# Patient Record
Sex: Male | Born: 1970 | Race: White | Hispanic: No | Marital: Married | Smoking: Never smoker
Health system: Southern US, Community
[De-identification: ages and names within clinical notes are randomized; demographics above are authoritative.]

## PROBLEM LIST (undated history)

## (undated) DIAGNOSIS — G43909 Migraine, unspecified, not intractable, without status migrainosus: Secondary | ICD-10-CM

## (undated) DIAGNOSIS — Z9889 Other specified postprocedural states: Secondary | ICD-10-CM

## (undated) DIAGNOSIS — E271 Primary adrenocortical insufficiency: Secondary | ICD-10-CM

## (undated) DIAGNOSIS — G030 Nonpyogenic meningitis: Secondary | ICD-10-CM

## (undated) DIAGNOSIS — E237 Disorder of pituitary gland, unspecified: Secondary | ICD-10-CM

## (undated) DIAGNOSIS — S069X9A Unspecified intracranial injury with loss of consciousness of unspecified duration, initial encounter: Secondary | ICD-10-CM

## (undated) DIAGNOSIS — G9600 Cerebrospinal fluid leak, unspecified: Secondary | ICD-10-CM

## (undated) DIAGNOSIS — S069XAA Unspecified intracranial injury with loss of consciousness status unknown, initial encounter: Secondary | ICD-10-CM

## (undated) HISTORY — PX: BACK SURGERY: SHX140

## (undated) HISTORY — PX: LUMBAR PERITONEAL SHUNT: SHX1984

## (undated) HISTORY — PX: OTHER SURGICAL HISTORY: SHX169

## (undated) HISTORY — PX: BRAIN TUMOR EXCISION: SHX577

---

## 2011-07-29 ENCOUNTER — Emergency Department (HOSPITAL_BASED_OUTPATIENT_CLINIC_OR_DEPARTMENT_OTHER)
Admission: EM | Admit: 2011-07-29 | Discharge: 2011-07-29 | Disposition: A | Discharge status: Home / Self Care | Payer: Managed Care, Other (non HMO) | Attending: Emergency Medicine | Admitting: Emergency Medicine

## 2011-07-29 ENCOUNTER — Encounter (HOSPITAL_BASED_OUTPATIENT_CLINIC_OR_DEPARTMENT_OTHER): Payer: Self-pay | Admitting: *Deleted

## 2011-07-29 ENCOUNTER — Emergency Department (INDEPENDENT_AMBULATORY_CARE_PROVIDER_SITE_OTHER): Payer: Managed Care, Other (non HMO)

## 2011-07-29 DIAGNOSIS — W298XXA Contact with other powered powered hand tools and household machinery, initial encounter: Secondary | ICD-10-CM | POA: Insufficient documentation

## 2011-07-29 DIAGNOSIS — S61209A Unspecified open wound of unspecified finger without damage to nail, initial encounter: Secondary | ICD-10-CM

## 2011-07-29 DIAGNOSIS — S61219A Laceration without foreign body of unspecified finger without damage to nail, initial encounter: Secondary | ICD-10-CM

## 2011-07-29 DIAGNOSIS — M79609 Pain in unspecified limb: Secondary | ICD-10-CM | POA: Insufficient documentation

## 2011-07-29 DIAGNOSIS — Y92009 Unspecified place in unspecified non-institutional (private) residence as the place of occurrence of the external cause: Secondary | ICD-10-CM | POA: Insufficient documentation

## 2011-07-29 DIAGNOSIS — R51 Headache: Secondary | ICD-10-CM

## 2011-07-29 MED ORDER — OXYCODONE-ACETAMINOPHEN 5-325 MG PO TABS: 2.0000 | ORAL_TABLET | ORAL | Status: AC | PRN

## 2011-07-29 MED ORDER — AMOXICILLIN-POT CLAVULANATE 875-125 MG PO TABS: 1.0000 | ORAL_TABLET | Freq: Two times a day (BID) | ORAL | Status: AC

## 2011-07-29 MED ORDER — LIDOCAINE HCL 2 % IJ SOLN
INTRAMUSCULAR | Status: AC
  Administered 2011-07-29: 20 mg
  Filled 2011-07-29: qty 1

## 2011-07-29 NOTE — ED Notes (Signed)
MD at bedside. 

## 2011-07-29 NOTE — Discharge Instructions (Signed)
Facial Laceration  A facial laceration is a cut on the face. Lacerations usually heal quickly, but they need special care to reduce scarring. It will take 1 to 2 years for the scar to lose its redness and to heal completely.  TREATMENT   Some facial lacerations may not require closure. Some lacerations may not be able to be closed due to an increased risk of infection. It is important to see your caregiver as soon as possible after an injury to minimize the risk of infection and to maximize the opportunity for successful closure.  If closure is appropriate, pain medicines may be given, if needed. The wound will be cleaned to help prevent infection. Your caregiver will use stitches (sutures), staples, wound glue (adhesive), or skin adhesive strips to repair the laceration. These tools bring the skin edges together to allow for faster healing and a better cosmetic outcome. However, all wounds will heal with a scar.   Once the wound has healed, scarring can be minimized by covering the wound with sunscreen during the day for 1 full year. Use a sunscreen with an SPF of at least 30. Sunscreen helps to reduce the pigment that will form in the scar. When applying sunscreen to a completely healed wound, massage the scar for a few minutes to help reduce the appearance of the scar. Use circular motions with your fingertips, on and around the scar. Do not massage a healing wound.  HOME CARE INSTRUCTIONS  For sutures:   Keep the wound clean and dry.   If you were given a bandage (dressing), you should change it at least once a day. Also change the dressing if it becomes wet or dirty, or as directed by your caregiver.   Wash the wound with soap and water 2 times a day. Rinse the wound off with water to remove all soap. Pat the wound dry with a clean towel.   After cleaning, apply a thin layer of the antibiotic ointment recommended by your caregiver. This will help prevent infection and keep the dressing from sticking.   You  may shower as usual after the first 24 hours. Do not soak the wound in water until the sutures are removed.   Only take over-the-counter or prescription medicines for pain, discomfort, or fever as directed by your caregiver.   Get your sutures removed as directed by your caregiver. With facial lacerations, sutures should usually be taken out after 4 to 5 days to avoid stitch marks.   Wait a few days after your sutures are removed before applying makeup.  For skin adhesive strips:   Keep the wound clean and dry.   Do not get the skin adhesive strips wet. You may bathe carefully, using caution to keep the wound dry.   If the wound gets wet, pat it dry with a clean towel.   Skin adhesive strips will fall off on their own. You may trim the strips as the wound heals. Do not remove skin adhesive strips that are still stuck to the wound. They will fall off in time.  For wound adhesive:   You may briefly wet your wound in the shower or bath. Do not soak or scrub the wound. Do not swim. Avoid periods of heavy perspiration until the skin adhesive has fallen off on its own. After showering or bathing, gently pat the wound dry with a clean towel.   Do not apply liquid medicine, cream medicine, ointment medicine, or makeup to your wound while the   skin adhesive is in place. This may loosen the film before your wound is healed.   If a dressing is placed over the wound, be careful not to apply tape directly over the skin adhesive. This may cause the adhesive to be pulled off before the wound is healed.   Avoid prolonged exposure to sunlight or tanning lamps while the skin adhesive is in place. Exposure to ultraviolet light in the first year will darken the scar.   The skin adhesive will usually remain in place for 5 to 10 days, then naturally fall off the skin. Do not pick at the adhesive film.  You may need a tetanus shot if:   You cannot remember when you had your last tetanus shot.   You have never had a tetanus  shot.  If you get a tetanus shot, your arm may swell, get red, and feel warm to the touch. This is common and not a problem. If you need a tetanus shot and you choose not to have one, there is a rare chance of getting tetanus. Sickness from tetanus can be serious.  SEEK IMMEDIATE MEDICAL CARE IF:   You develop redness, pain, or swelling around the wound.   There is yellowish-white fluid (pus) coming from the wound.   You develop chills or a fever.  MAKE SURE YOU:   Understand these instructions.   Will watch your condition.   Will get help right away if you are not doing well or get worse.  Document Released: 06/17/2004 Document Revised: 04/29/2011 Document Reviewed: 11/02/2010  [employer] Patient Information 2012 [employer].

## 2011-07-29 NOTE — ED Provider Notes (Signed)
History     CSN: [id number]  Arrival date & time 07/29/11  1400   First MD Initiated Contact with Patient 07/29/11 1421      Chief Complaint  Patient presents with  . Finger Injury    Pt. reports he was using a drill and the drill slipped and went into the L Middle finger.  Pt. also reports he has a headache.  Pt. reports he tries to handle pain and has severe headaches and can no longer work.    (Consider location/radiation/quality/duration/timing/severity/associated sxs/prior treatment) Patient is a 41 y.o. male presenting with hand injury. The history is provided by the patient. No language interpreter was used.  Hand Injury  The incident occurred 1 to 2 hours ago. The incident occurred at home. The injury mechanism was an incision. The pain is present in the left fingers. The quality of the pain is described as aching. The pain is at a severity of 6/10. The pain is moderate. The pain has been constant since the incident. He reports no foreign bodies present. He has tried nothing for the symptoms.  pt reports he was using a drill and slipped.  Pt reports drill went into tip of finger  No past medical history on file.  Past Surgical History  Procedure Date  . Brain tumor excision     non milignant tumor.    No family history on file.  History  Substance Use Topics  . Smoking status: Never Smoker   . Smokeless tobacco: Not on file  . Alcohol Use: No      Review of Systems  Skin: Positive for wound.  All other systems reviewed and are negative.    Allergies  Benadryl; Demerol; Morphine and related; and Phenergan  Home Medications   Current Outpatient Rx  Name Route Sig Dispense Refill  . ROPINIROLE HCL 0.25 MG PO TABS Oral Take 0.25 mg by mouth 3 (three) times daily.    . TESTOSTERONE ENANTHATE 200 MG/ML IM OIL Intramuscular Inject into the muscle every 14 (fourteen) days. For IM use only      BP 127/75  Pulse 74  Temp(Src) 98 F (36.7 C) (Oral)  Resp 20   SpO2 98%  Physical Exam  Vitals reviewed. Constitutional: He is oriented to person, place, and time. He appears well-developed and well-nourished.  HENT:  Head: Normocephalic.  Musculoskeletal: He exhibits tenderness.       3mm punture to left midle finger, from,  Ns and nv intact  Neurological: He is alert and oriented to person, place, and time. He has normal reflexes.  Skin: Skin is warm.  Psychiatric: He has a normal mood and affect.    ED Course  Procedures (including critical care time)  Labs Reviewed - No data to display No results found.   No diagnosis found.    MDM  Digital block for pain.  Xray shows no bone involvement.  I will cover with antibiotics.          Langston Masker, [place] 07/29/11 2054

## 2011-07-29 NOTE — ED Notes (Signed)
Pt. Asked for the lidocaine injection in the L middle finger and now he says the injection isn't helping.  Will discharge Pt. At this time.

## 2011-07-29 NOTE — ED Notes (Signed)
Pt. Just moved last year from [place] and reports he sees a Brain Dr. From [facility] for headaches.  Pt. Reports his Dr. Dewayne Hatch a standing order for pt. To get meds for his headaches.  Pt. Reports there is no need for him to go to [facility] due to it being an ED.

## 2011-07-29 NOTE — ED Notes (Signed)
Pt. Asking for his finger to be numbed before he leaves.

## 2011-07-30 NOTE — ED Provider Notes (Signed)
History/physical exam/procedure(s) were performed by non-physician practitioner and as supervising physician I was immediately available for consultation/collaboration. I have reviewed all notes and am in agreement with care and plan.   Hilario Quarry, MD 07/30/11 [phone]

## 2011-11-10 ENCOUNTER — Encounter (HOSPITAL_BASED_OUTPATIENT_CLINIC_OR_DEPARTMENT_OTHER): Payer: Self-pay

## 2011-11-10 ENCOUNTER — Emergency Department (HOSPITAL_BASED_OUTPATIENT_CLINIC_OR_DEPARTMENT_OTHER)
Admission: EM | Admit: 2011-11-10 | Discharge: 2011-11-10 | Disposition: A | Discharge status: Home / Self Care | Payer: Managed Care, Other (non HMO) | Attending: Emergency Medicine | Admitting: Emergency Medicine

## 2011-11-10 DIAGNOSIS — W268XXA Contact with other sharp object(s), not elsewhere classified, initial encounter: Secondary | ICD-10-CM | POA: Insufficient documentation

## 2011-11-10 DIAGNOSIS — S61409A Unspecified open wound of unspecified hand, initial encounter: Secondary | ICD-10-CM | POA: Insufficient documentation

## 2011-11-10 DIAGNOSIS — S61411A Laceration without foreign body of right hand, initial encounter: Secondary | ICD-10-CM

## 2011-11-10 HISTORY — DX: Other specified postprocedural states: Z98.890

## 2011-11-10 NOTE — ED Provider Notes (Signed)
Medical screening examination/treatment/procedure(s) were performed by non-physician practitioner and as supervising physician I was immediately available for consultation/collaboration.    Nelia Shi, MD 11/10/11 1328

## 2011-11-10 NOTE — ED Provider Notes (Signed)
History     CSN: [id number]  Arrival date & time 11/10/11  1243   First MD Initiated Contact with Patient 11/10/11 1256      Chief Complaint  Patient presents with  . Extremity Laceration    (Consider location/radiation/quality/duration/timing/severity/associated sxs/prior treatment) Patient is a 41 y.o. male presenting with hand injury. The history is provided by the patient. No language interpreter was used.  Hand Injury  The incident occurred less than 1 hour ago. The incident occurred at home. The injury mechanism was an incision. The pain is present in the right hand. The pain is mild. The pain has been constant since the incident. He has tried nothing for the symptoms.  Pt cut hand over a slicer while cutting potatos  Past Medical History  Diagnosis Date  . H/O brain surgery     Past Surgical History  Procedure Date  . Brain tumor excision     non milignant tumor.  . Lumbar peritoneal shunt   . Back surgery   . Joint surgeries     No family history on file.  History  Substance Use Topics  . Smoking status: Never Smoker   . Smokeless tobacco: Never Used  . Alcohol Use: Yes     rarely      Review of Systems  Skin: Positive for wound.  All other systems reviewed and are negative.    Allergies  Benadryl; Demerol; Morphine and related; and Promethazine hcl  Home Medications   Current Outpatient Rx  Name Route Sig Dispense Refill  . DICLOFENAC SODIUM 50 MG PO TBEC Oral Take 50 mg by mouth as needed.    Marland Kitchen BUTORPHANOL TARTRATE 10 MG/ML NA SOLN Nasal Place 2 sprays into the nose every 4 (four) hours as needed. For allergies    . ROPINIROLE HCL 0.25 MG PO TABS Oral Take 6 mg by mouth daily.     . TESTOSTERONE ENANTHATE 200 MG/ML IM OIL Intramuscular Inject into the muscle every 14 (fourteen) days. For IM use only      BP 129/77  Pulse 93  Temp 98.5 F (36.9 C) (Oral)  Resp 16  Ht 6\' 1"  (1.854 m)  Wt 188 lb (85.276 kg)  BMI 24.80 kg/m2  SpO2  99%  Physical Exam  Vitals reviewed. Constitutional: He is oriented to person, place, and time. He appears well-developed and well-nourished.  Musculoskeletal: He exhibits tenderness.       Laceration right had,  3 one cm superficial laceration minimal gapping  Neurological: He is alert and oriented to person, place, and time.  Skin: Skin is warm.  Psychiatric: He has a normal mood and affect.    ED Course  LACERATION REPAIR Date/Time: 11/10/2011 1:25 PM Performed by: Elson Areas Authorized by: Elson Areas Consent: Verbal consent obtained. Body area: upper extremity Laceration length: 3 cm Skin closure: glue Technique: simple Approximation: loose Patient tolerance: Patient tolerated the procedure well with no immediate complications.   (including critical care time)  Labs Reviewed - No data to display No results found.   1. Laceration of right hand       MDM  Counseled on glue care        Lonia Skinner [place], [place] 11/10/11 1326

## 2011-11-10 NOTE — Discharge Instructions (Signed)

## 2011-11-10 NOTE — ED Notes (Signed)
Laceration sustained to palmar surface of right hand while cutting potatoes.

## 2011-11-10 NOTE — ED Notes (Signed)
Laceration to right hand °

## 2011-11-22 ENCOUNTER — Encounter (HOSPITAL_BASED_OUTPATIENT_CLINIC_OR_DEPARTMENT_OTHER): Payer: Self-pay | Admitting: Family Medicine

## 2011-11-22 ENCOUNTER — Emergency Department (HOSPITAL_BASED_OUTPATIENT_CLINIC_OR_DEPARTMENT_OTHER): Payer: Managed Care, Other (non HMO)

## 2011-11-22 ENCOUNTER — Emergency Department (HOSPITAL_BASED_OUTPATIENT_CLINIC_OR_DEPARTMENT_OTHER)
Admission: EM | Admit: 2011-11-22 | Discharge: 2011-11-22 | Disposition: A | Discharge status: Home / Self Care | Payer: Managed Care, Other (non HMO) | Attending: Emergency Medicine | Admitting: Emergency Medicine

## 2011-11-22 DIAGNOSIS — S139XXA Sprain of joints and ligaments of unspecified parts of neck, initial encounter: Secondary | ICD-10-CM | POA: Insufficient documentation

## 2011-11-22 DIAGNOSIS — W108XXA Fall (on) (from) other stairs and steps, initial encounter: Secondary | ICD-10-CM | POA: Insufficient documentation

## 2011-11-22 DIAGNOSIS — R0789 Other chest pain: Secondary | ICD-10-CM

## 2011-11-22 DIAGNOSIS — R079 Chest pain, unspecified: Secondary | ICD-10-CM | POA: Insufficient documentation

## 2011-11-22 DIAGNOSIS — R071 Chest pain on breathing: Secondary | ICD-10-CM | POA: Insufficient documentation

## 2011-11-22 DIAGNOSIS — IMO0002 Reserved for concepts with insufficient information to code with codable children: Secondary | ICD-10-CM | POA: Insufficient documentation

## 2011-11-22 DIAGNOSIS — S161XXA Strain of muscle, fascia and tendon at neck level, initial encounter: Secondary | ICD-10-CM

## 2011-11-22 DIAGNOSIS — W19XXXA Unspecified fall, initial encounter: Secondary | ICD-10-CM

## 2011-11-22 HISTORY — DX: Cerebrospinal fluid leak, unspecified: G96.00

## 2011-11-22 HISTORY — DX: Nonpyogenic meningitis: G03.0

## 2011-11-22 HISTORY — DX: Cerebrospinal fluid leak: G96.0

## 2011-11-22 MED ORDER — OXYCODONE-ACETAMINOPHEN 5-325 MG PO TABS
1.0000 | ORAL_TABLET | Freq: Once | ORAL | Status: AC
  Administered 2011-11-22: 1 via ORAL
  Filled 2011-11-22: qty 1

## 2011-11-22 MED ORDER — IBUPROFEN 600 MG PO TABS: 600.0000 mg | ORAL_TABLET | Freq: Four times a day (QID) | ORAL | Status: AC | PRN

## 2011-11-22 MED ORDER — IBUPROFEN 800 MG PO TABS
800.0000 mg | ORAL_TABLET | Freq: Once | ORAL | Status: AC
  Administered 2011-11-22: 800 mg via ORAL
  Filled 2011-11-22: qty 1

## 2011-11-22 MED ORDER — OXYCODONE-ACETAMINOPHEN 5-325 MG PO TABS: 1.0000 | ORAL_TABLET | Freq: Four times a day (QID) | ORAL | Status: AC | PRN

## 2011-11-22 NOTE — Discharge Instructions (Signed)
Return to the ED with any concerns including difficulty breathing, weakness of arms or legs, or any other alarming symptoms  The xrays of your chest and neck today did not show any acute abnormalities

## 2011-11-22 NOTE — ED Provider Notes (Signed)
History   This chart was scribed for Seth Chick, MD by Shari Heritage. The patient was seen in room MH12/MH12. Patient's care was started at 1643.     CSN: [id number]  Arrival date & time 11/22/11  1643   First MD Initiated Contact with Patient 11/22/11 1724      Chief Complaint  Patient presents with  . Fall    (Consider location/radiation/quality/duration/timing/severity/associated sxs/prior treatment) Patient is a 41 y.o. male presenting with fall. The history is provided by the patient. No language interpreter was used.  Fall The accident occurred 1 to 2 hours ago. The fall occurred while walking (Patient was descending stairs.). He fell from a height of 1 to 2 ft. He landed on a hard floor. There was no blood loss. Point of impact: Chest. Pain location: Neck, chest and right arm. The pain is severe. He was ambulatory at the scene. There was no entrapment after the fall. There was no drug use involved in the accident. Pertinent negatives include no visual change, no abdominal pain, no nausea and no vomiting. Prehospitalization: Patient was fitted with a c-collar during triage.   Seth Arellano is a 41 y.o. male who presents to the Emergency Department complaining of a fall. Patient says that he was descending curved stairs, lost his balance, fell forward and hit the banister. Patient says he didn't hit his head, but his neck snapped. Patient is experiencing pain in his neck, lower chest bilaterally and  Patient denies back pain and abdominal pain. Patient says he is being followed by a neurosurgeon at [facility]. Patient with h/o non-malignant acoustic neuroma.   Patient explains that the acoustic neuroma causes persistent balance issues. Patient has surgical h/o brain tumor excision, lumbar peritoneal shunt, back surgery and joint surgeries. Patient has never smoked.  Patient was fitted in C-collar upon arrival in ED.  Past Medical History  Diagnosis Date  . H/O brain surgery   .  Chemical meningitis   . CSF leak     Past Surgical History  Procedure Date  . Brain tumor excision     non milignant tumor.  . Lumbar peritoneal shunt   . Back surgery   . Joint surgeries     No family history on file.  History  Substance Use Topics  . Smoking status: Never Smoker   . Smokeless tobacco: Never Used  . Alcohol Use: Yes     rarely      Review of Systems  HENT: Positive for neck pain.   Cardiovascular: Positive for chest pain.  Gastrointestinal: Negative for nausea, vomiting and abdominal pain.  All other systems reviewed and are negative.  Patient is positive for right arm pain.  Allergies  Benadryl; Compazine; Demerol; Morphine and related; and Promethazine hcl  Home Medications   Current Outpatient Rx  Name Route Sig Dispense Refill  . FEXOFENADINE HCL 30 MG PO TABS Oral Take 30 mg by mouth 2 (two) times daily.    Marland Kitchen PREDNISONE 20 MG PO TABS Oral Take 20 mg by mouth daily. Patient started this medication on November 18, 2011 Tapered:  3 a day for 3 days, 2 a day for 3 days, 1 a day for 3 days.    Marland Kitchen ROPINIROLE HCL 0.25 MG PO TABS Oral Take 6 mg by mouth daily.     Marland Kitchen BUTORPHANOL TARTRATE 10 MG/ML NA SOLN Nasal Place 2 sprays into the nose every 4 (four) hours as needed. For allergies    . IBUPROFEN 600 MG  PO TABS Oral Take 1 tablet (600 mg total) by mouth every 6 (six) hours as needed for pain. 30 tablet 0  . OXYCODONE-ACETAMINOPHEN 5-325 MG PO TABS Oral Take 1-2 tablets by mouth every 6 (six) hours as needed for pain. 15 tablet 0  . TESTOSTERONE ENANTHATE 200 MG/ML IM OIL Intramuscular Inject into the muscle every 14 (fourteen) days. For IM use only      BP 140/90  Pulse 66  Temp 98 F (36.7 C) (Oral)  Resp 16  SpO2 99%  Physical Exam  Nursing note and vitals reviewed. Constitutional: He is oriented to person, place, and time. He appears well-developed and well-nourished.  HENT:  Head: Normocephalic and atraumatic.  Eyes: Conjunctivae and EOM  are normal. Pupils are equal, round, and reactive to light.  Neck: Normal range of motion. Neck supple.  Cardiovascular: Normal rate and regular rhythm.   Pulmonary/Chest: Effort normal and breath sounds normal. He exhibits tenderness.       Tenderness to anterior chest wall.  Abdominal: Soft. Bowel sounds are normal.  Musculoskeletal: Normal range of motion.       Cervical back: He exhibits tenderness.       Mild paraspinal C-spine tenderness. No other spinal tenderness.   Neurological: He is alert and oriented to person, place, and time.  Skin: Skin is warm and dry. Abrasion noted.          Superficial abrasions to anterior right forearm. nO CREPITUS.  Psychiatric: He has a normal mood and affect.    ED Course  Procedures (including critical care time) DIAGNOSTIC STUDIES: Oxygen Saturation is 99% on room air, normal by my interpretation.    COORDINATION OF CARE: 5:42PM- Patient informed of current plan for treatment and evaluation and agrees with plan at this time. Will order x-rays of cervical spine and chest.   Labs Reviewed - No data to display  Dg Chest 2 View  11/22/2011  *RADIOLOGY REPORT*  Clinical Data: Anterior chest pain secondary to a fall.  CHEST - 2 VIEW  Comparison: None.  Findings: The heart size and pulmonary vascularity are normal and the lungs are clear.  No osseous abnormality.  IMPRESSION: Normal chest.  Original Report Authenticated By: Gwynn Burly, M.D.   Dg Cervical Spine Complete  11/22/2011  *RADIOLOGY REPORT*  Clinical Data: Neck pain secondary to a fall.  CERVICAL SPINE - COMPLETE 4+ VIEW  Comparison: None.  Findings: There is no fracture, subluxation, disc space narrowing, or prevertebral soft tissue swelling.  No facet arthritis.  IMPRESSION: Normal cervical spine.  Original Report Authenticated By: Gwynn Burly, M.D.     1. Fall   2. Chest wall pain   3. Cervical strain       MDM  Pt presents with c/o fall and anterior chest wall pain  and neck pain.  xrays reassuring.  Neuro exam normal.  Discharged with strict return precautions, he was treated with percocet in the ED, given rx as well.  Pt agreeable with this plan.     I personally performed the services described in this documentation, which was scribed in my presence. The recorded information has been reviewed and considered.    Seth Chick, MD 11/23/11 2352

## 2011-11-22 NOTE — ED Notes (Addendum)
Pt sts he has a neurological disorder and fell down stairs last night injuring right and left rib cage. Pt sts he went to urgent care but "they could not do imaging there because of my insurance". Pt c/o neck pain, c-collar applied in triage. Pt unable to tolerate automatic blood pressure stating "it makes my head hurt" and requesting manual bp. Pt ambulatory in triage and insists upon ambulating to room.

## 2012-08-25 ENCOUNTER — Encounter (HOSPITAL_BASED_OUTPATIENT_CLINIC_OR_DEPARTMENT_OTHER): Payer: Self-pay

## 2012-08-25 ENCOUNTER — Emergency Department (HOSPITAL_BASED_OUTPATIENT_CLINIC_OR_DEPARTMENT_OTHER): Payer: Medicare Other

## 2012-08-25 ENCOUNTER — Emergency Department (HOSPITAL_BASED_OUTPATIENT_CLINIC_OR_DEPARTMENT_OTHER)
Admission: EM | Admit: 2012-08-25 | Discharge: 2012-08-25 | Disposition: A | Discharge status: Home / Self Care | Payer: Medicare Other | Attending: Emergency Medicine | Admitting: Emergency Medicine

## 2012-08-25 DIAGNOSIS — S61219A Laceration without foreign body of unspecified finger without damage to nail, initial encounter: Secondary | ICD-10-CM

## 2012-08-25 DIAGNOSIS — Z23 Encounter for immunization: Secondary | ICD-10-CM | POA: Insufficient documentation

## 2012-08-25 DIAGNOSIS — Z8679 Personal history of other diseases of the circulatory system: Secondary | ICD-10-CM | POA: Insufficient documentation

## 2012-08-25 DIAGNOSIS — Z9889 Other specified postprocedural states: Secondary | ICD-10-CM | POA: Insufficient documentation

## 2012-08-25 DIAGNOSIS — Z8639 Personal history of other endocrine, nutritional and metabolic disease: Secondary | ICD-10-CM | POA: Insufficient documentation

## 2012-08-25 DIAGNOSIS — IMO0002 Reserved for concepts with insufficient information to code with codable children: Secondary | ICD-10-CM | POA: Insufficient documentation

## 2012-08-25 DIAGNOSIS — Y929 Unspecified place or not applicable: Secondary | ICD-10-CM | POA: Insufficient documentation

## 2012-08-25 DIAGNOSIS — Y9389 Activity, other specified: Secondary | ICD-10-CM | POA: Insufficient documentation

## 2012-08-25 DIAGNOSIS — S61209A Unspecified open wound of unspecified finger without damage to nail, initial encounter: Secondary | ICD-10-CM | POA: Insufficient documentation

## 2012-08-25 DIAGNOSIS — W278XXA Contact with other nonpowered hand tool, initial encounter: Secondary | ICD-10-CM | POA: Insufficient documentation

## 2012-08-25 DIAGNOSIS — Z862 Personal history of diseases of the blood and blood-forming organs and certain disorders involving the immune mechanism: Secondary | ICD-10-CM | POA: Insufficient documentation

## 2012-08-25 DIAGNOSIS — Z87828 Personal history of other (healed) physical injury and trauma: Secondary | ICD-10-CM | POA: Insufficient documentation

## 2012-08-25 DIAGNOSIS — Z79899 Other long term (current) drug therapy: Secondary | ICD-10-CM | POA: Insufficient documentation

## 2012-08-25 DIAGNOSIS — Z8661 Personal history of infections of the central nervous system: Secondary | ICD-10-CM | POA: Insufficient documentation

## 2012-08-25 DIAGNOSIS — Z8669 Personal history of other diseases of the nervous system and sense organs: Secondary | ICD-10-CM | POA: Insufficient documentation

## 2012-08-25 HISTORY — DX: Unspecified intracranial injury with loss of consciousness status unknown, initial encounter: S06.9XAA

## 2012-08-25 HISTORY — DX: Disorder of pituitary gland, unspecified: E23.7

## 2012-08-25 HISTORY — DX: Unspecified intracranial injury with loss of consciousness of unspecified duration, initial encounter: S06.9X9A

## 2012-08-25 HISTORY — DX: Migraine, unspecified, not intractable, without status migrainosus: G43.909

## 2012-08-25 MED ORDER — ONDANSETRON 4 MG PO TBDP
ORAL_TABLET | ORAL | Status: AC
  Filled 2012-08-25: qty 1

## 2012-08-25 MED ORDER — LIDOCAINE HCL 2 % IJ SOLN
INTRAMUSCULAR | Status: AC
  Administered 2012-08-25: 13:00:00
  Filled 2012-08-25: qty 20

## 2012-08-25 MED ORDER — ONDANSETRON 4 MG PO TBDP
4.0000 mg | ORAL_TABLET | Freq: Once | ORAL | Status: AC
  Administered 2012-08-25: 4 mg via ORAL

## 2012-08-25 MED ORDER — TETANUS-DIPHTH-ACELL PERTUSSIS 5-2.5-18.5 LF-MCG/0.5 IM SUSP
0.5000 mL | Freq: Once | INTRAMUSCULAR | Status: AC
  Administered 2012-08-25: 0.5 mL via INTRAMUSCULAR
  Filled 2012-08-25: qty 0.5

## 2012-08-25 NOTE — ED Notes (Signed)
EDNP at [facility] 

## 2012-08-25 NOTE — ED Provider Notes (Signed)
History     CSN: [id number]  Arrival date & time 08/25/12  1233   First MD Initiated Contact with Patient 08/25/12 1240      Chief Complaint  Patient presents with  . Finger Injury    (Consider location/radiation/quality/duration/timing/severity/associated sxs/prior treatment) HPI Comments: Pt states that he cut his left index finger about 30 minutes pta:pt states that he cut it with a chainsaw:pt states that he is not having any problem with movement:pt state that bleeding is under control:pt states that her tetanus is utd  The history is provided by the patient. No language interpreter was used.    Past Medical History  Diagnosis Date  . H/O brain surgery   . Chemical meningitis   . CSF leak   . Migraine   . TBI (traumatic brain injury)   . Pituitary abnormality     Past Surgical History  Procedure Laterality Date  . Brain tumor excision      non milignant tumor.  . Lumbar peritoneal shunt    . Back surgery    . Joint surgeries      No family history on file.  History  Substance Use Topics  . Smoking status: Never Smoker   . Smokeless tobacco: Never Used  . Alcohol Use: Yes     Comment: rarely      Review of Systems  Constitutional: Negative.   Respiratory: Negative.   Cardiovascular: Negative.     Allergies  Benadryl; Compazine; Demerol; Dopamine; Morphine and related; and Promethazine hcl  Home Medications   Current Outpatient Rx  Name  Route  Sig  Dispense  Refill  . METHADONE HCL PO   Oral   Take by mouth.         . sodium chloride 0.9 % SOLN with hydrocortisone sodium succinate 100 mg/2 mL SOLR 10 mg/mL   Intravenous   Inject into the vein continuous.         . butorphanol (STADOL) 10 MG/ML nasal spray   Nasal   Place 2 sprays into the nose every 4 (four) hours as needed. For allergies         . fexofenadine (ALLEGRA) 30 MG tablet   Oral   Take 30 mg by mouth 2 (two) times daily.         . predniSONE (DELTASONE) 20 MG  tablet   Oral   Take 20 mg by mouth daily. Patient started this medication on November 18, 2011 Tapered:  3 a day for 3 days, 2 a day for 3 days, 1 a day for 3 days.         Marland Kitchen rOPINIRole (REQUIP) 0.25 MG tablet   Oral   Take 6 mg by mouth daily.          Marland Kitchen testosterone enanthate (DELATESTRYL) 200 MG/ML injection   Intramuscular   Inject into the muscle every 14 (fourteen) days. For IM use only           BP 145/92  Pulse 60  Temp(Src) 97.7 F (36.5 C) (Oral)  Resp 20  Ht 6' (1.829 m)  Wt 188 lb (85.276 kg)  BMI 25.49 kg/m2  SpO2 99%  Physical Exam  Nursing note and vitals reviewed. Constitutional: He is oriented to person, place, and time. He appears well-developed and well-nourished.  Cardiovascular: Normal rate and regular rhythm.   Pulmonary/Chest: Effort normal and breath sounds normal.  Musculoskeletal: Normal range of motion.  Pt has laceration over the mip of left index finger on  the dorsal aspect:pt has full [id number] intact  Neurological: He is alert and oriented to person, place, and time.    ED Course  LACERATION REPAIR Date/Time: 08/25/2012 2:05 PM Performed by: Teressa Lower Authorized by: Teressa Lower Consent: Verbal consent obtained. Risks and benefits: risks, benefits and alternatives were discussed Consent given by: patient Patient identity confirmed: verbally with patient Time out: Immediately prior to procedure a "time out" was called to verify the correct patient, procedure, equipment, support staff and site/side marked as required. Body area: upper extremity Location details: left index finger Laceration length: 1 cm Foreign bodies: no foreign bodies Anesthesia: digital block Local anesthetic: lidocaine 1% without epinephrine Irrigation solution: tap water Irrigation method: tap Skin closure: 4-0 Prolene Number of sutures: 2 Technique: simple Approximation: close Approximation difficulty: simple Patient tolerance:  Patient tolerated the procedure well with no immediate complications.   (including critical care time)  Labs Reviewed - No data to display Dg Finger Index Left  08/25/2012  *RADIOLOGY REPORT*  Clinical Data: Left index finger injury  LEFT INDEX FINGER 2+V  Comparison: None.  Findings: Three views of the left second finger submitted.  No acute fracture or subluxation.  IMPRESSION: No acute fracture or subluxation.   Original Report Authenticated By: Natasha Mead, M.D.      1. Finger laceration, initial encounter       MDM  Pt wound closed without any problem:pt has no fracture:tetanus given        Teressa Lower, NP 08/25/12 1406

## 2012-08-25 NOTE — ED Notes (Signed)
Cut left index finger with chainsaw approx 30 min PTA-lac noted-bleeding controlled

## 2012-08-27 NOTE — ED Provider Notes (Signed)
Medical screening examination/treatment/procedure(s) were performed by non-physician practitioner and as supervising physician I was immediately available for consultation/collaboration.  Geoffery Lyons, MD 08/27/12 [phone]

## 2012-11-22 ENCOUNTER — Encounter: Payer: Self-pay | Admitting: Neurology

## 2012-11-22 ENCOUNTER — Telehealth: Payer: Self-pay | Admitting: Neurology

## 2012-11-22 NOTE — Telephone Encounter (Signed)
Spoke to patient and he will go to [facility], if he needs to since we are closer he would follow-up here.

## 2012-11-22 NOTE — Telephone Encounter (Signed)
If he sees neurology at [facility], he may not need appt here. It is up to him. thx s

## 2012-11-22 NOTE — Telephone Encounter (Signed)
Patient missed appointment today with Dr. Frances Furbish and wants to know if he needs to make another appointment with [facility] since he has an appointment at [facility]? Please advise.

## 2013-01-10 IMAGING — CR DG CHEST 2V
2 series · 2 of 2 positions shown · non-contrast
Comparison: None.

CLINICAL DATA: Anterior chest pain secondary to a fall.

CHEST - 2 VIEW

[w chest pa]
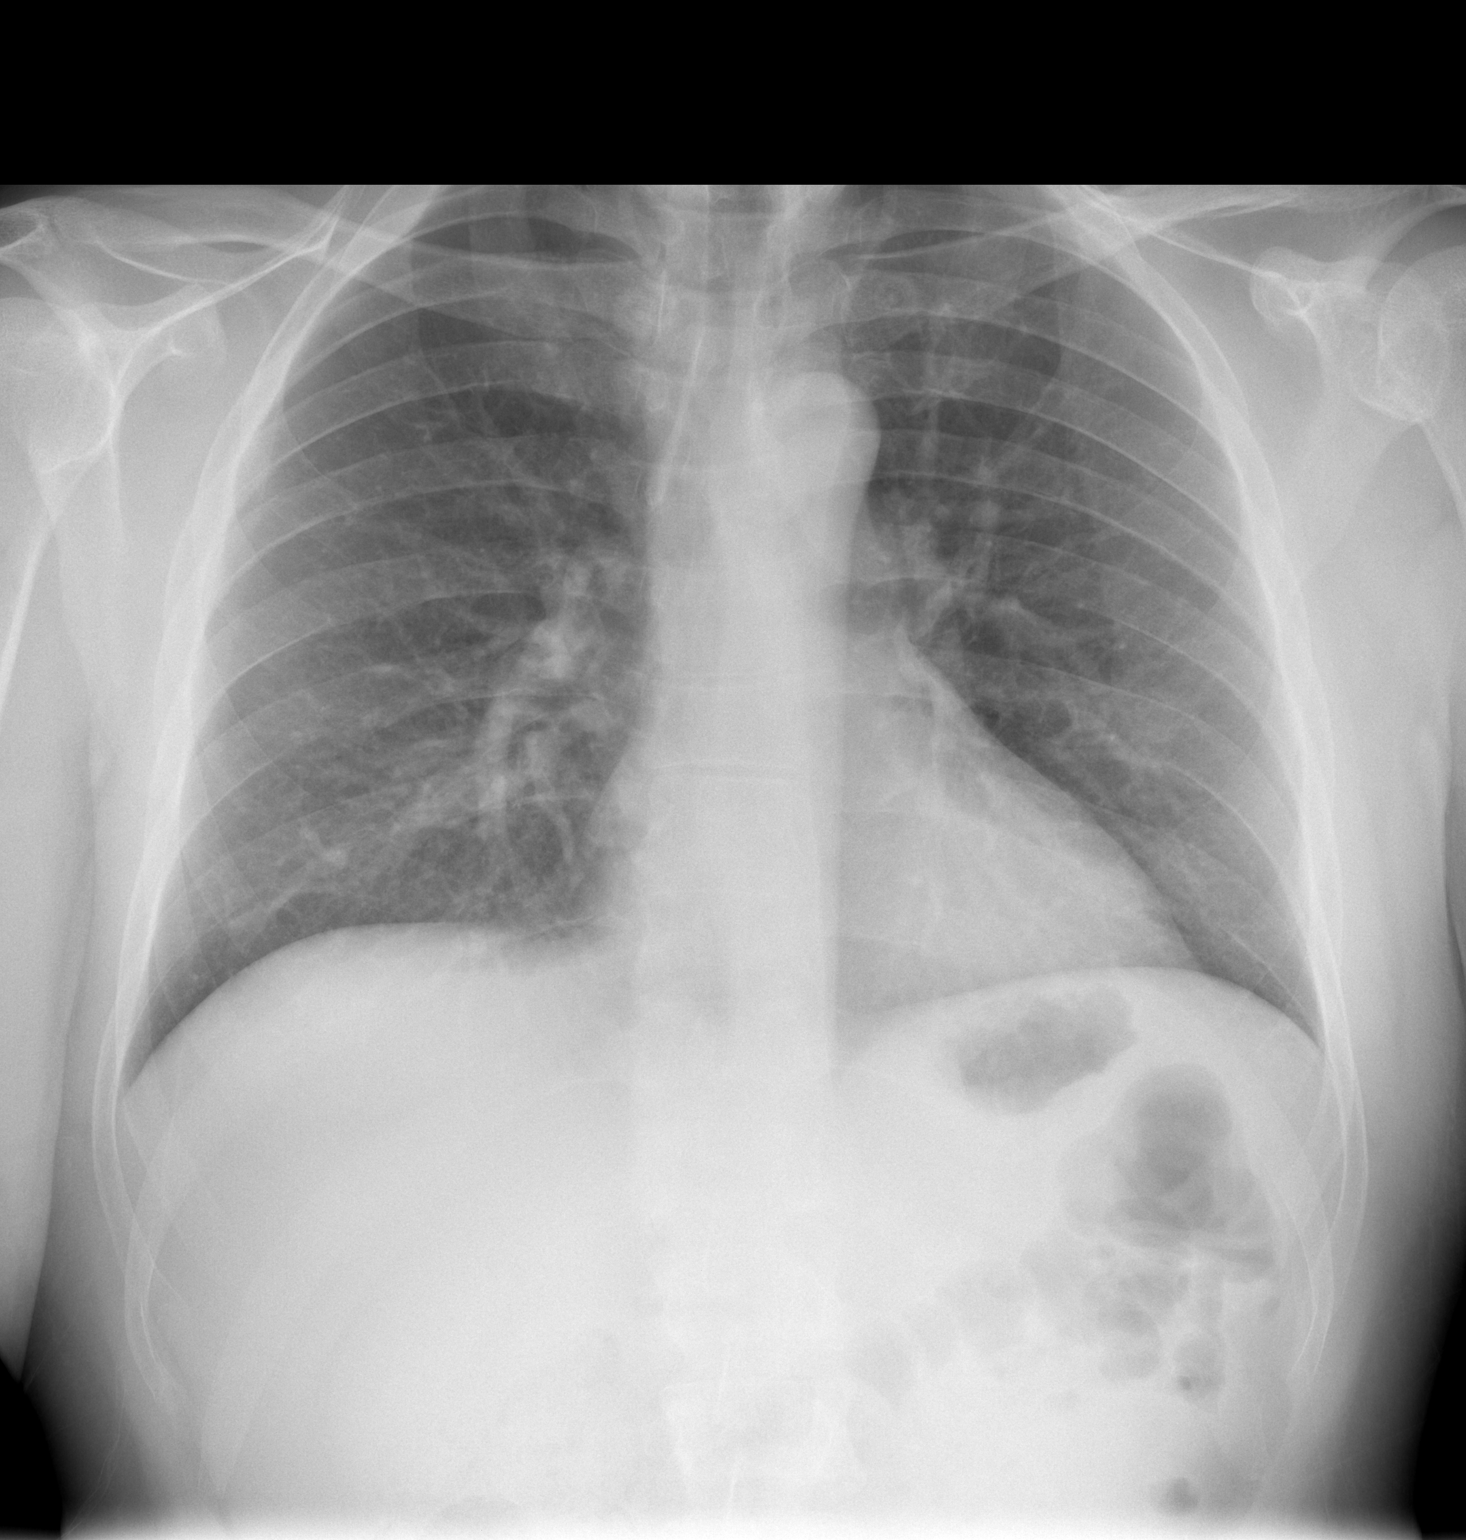

[w chest lat]
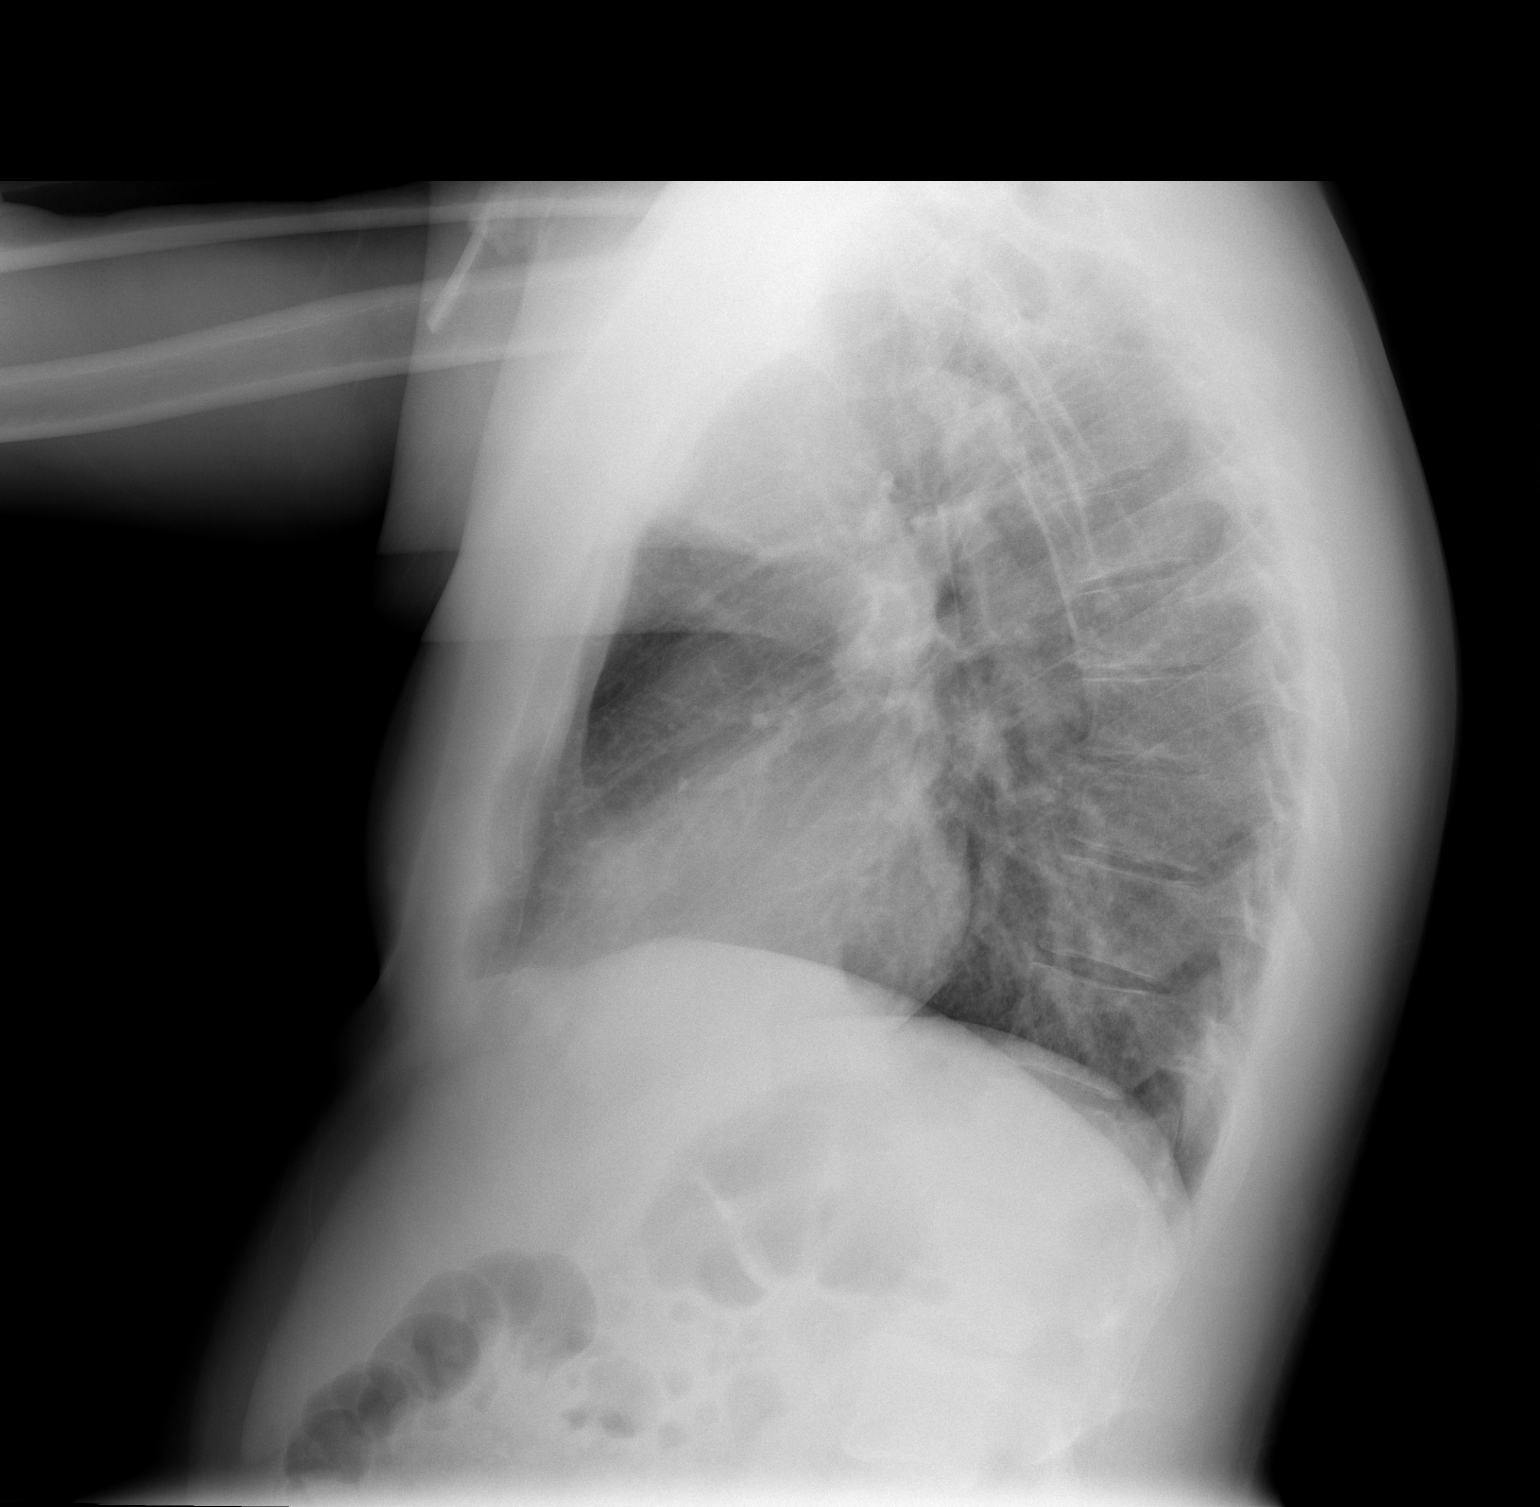

[2 of 2 positions shown; findings below may reference images not displayed]

FINDINGS: The heart size and pulmonary vascularity are normal and
the lungs are clear.  No osseous abnormality.
IMPRESSION: Normal chest.

## 2016-10-15 ENCOUNTER — Emergency Department (HOSPITAL_BASED_OUTPATIENT_CLINIC_OR_DEPARTMENT_OTHER): Payer: Medicare Other

## 2016-10-15 ENCOUNTER — Emergency Department (HOSPITAL_BASED_OUTPATIENT_CLINIC_OR_DEPARTMENT_OTHER)
Admission: EM | Admit: 2016-10-15 | Discharge: 2016-10-15 | Disposition: A | Discharge status: Home / Self Care | Payer: Medicare Other | Attending: Emergency Medicine | Admitting: Emergency Medicine

## 2016-10-15 ENCOUNTER — Encounter (HOSPITAL_BASED_OUTPATIENT_CLINIC_OR_DEPARTMENT_OTHER): Payer: Self-pay | Admitting: *Deleted

## 2016-10-15 DIAGNOSIS — S93501A Unspecified sprain of right great toe, initial encounter: Secondary | ICD-10-CM | POA: Diagnosis not present

## 2016-10-15 DIAGNOSIS — W228XXA Striking against or struck by other objects, initial encounter: Secondary | ICD-10-CM | POA: Diagnosis not present

## 2016-10-15 DIAGNOSIS — Y998 Other external cause status: Secondary | ICD-10-CM | POA: Insufficient documentation

## 2016-10-15 DIAGNOSIS — Y9301 Activity, walking, marching and hiking: Secondary | ICD-10-CM | POA: Diagnosis not present

## 2016-10-15 DIAGNOSIS — Y929 Unspecified place or not applicable: Secondary | ICD-10-CM | POA: Diagnosis not present

## 2016-10-15 DIAGNOSIS — S93504A Unspecified sprain of right lesser toe(s), initial encounter: Secondary | ICD-10-CM

## 2016-10-15 DIAGNOSIS — S99921A Unspecified injury of right foot, initial encounter: Secondary | ICD-10-CM | POA: Diagnosis present

## 2016-10-15 HISTORY — DX: Primary adrenocortical insufficiency: E27.1

## 2016-10-15 NOTE — ED Notes (Signed)
ED Provider at bedside. 

## 2016-10-15 NOTE — ED Provider Notes (Signed)
[facility]   CSN: [id number] Arrival date & time: 10/15/16  1537     History   Chief Complaint Chief Complaint  Patient presents with  . Toe Injury    HPI Seth Arellano is a 46 y.o. male.  HPI Patient stubbed his right fifth toe yesterday while walking. States he's had pain and swelling in the toe. Has been able to ambulate with a limp. Denies any numbness. No other injury. Past Medical History:  Diagnosis Date  . Adrenal insufficiency (Addison's disease) (HCC)   . Chemical meningitis   . CSF leak   . H/O brain surgery   . Migraine   . Migraine   . Pituitary abnormality (HCC)   . TBI (traumatic brain injury) (HCC)     There are no active problems to display for this patient.   Past Surgical History:  Procedure Laterality Date  . BACK SURGERY    . BRAIN TUMOR EXCISION     non milignant tumor.  . joint surgeries    . LUMBAR PERITONEAL SHUNT         Home Medications    Prior to Admission medications   Medication Sig Start Date End Date Taking? Authorizing Provider  DICLOFENAC POTASSIUM PO Take by mouth.   Yes [provider]  hydrocortisone (CORTEF) 20 MG tablet Take 30 mg by mouth daily.   Yes [provider]  loratadine (CLARITIN) 10 MG tablet Take 10 mg by mouth daily.   Yes [provider]  METHADONE HCL PO Take by mouth.   Yes [provider]  ondansetron (ZOFRAN) 4 MG tablet Take 4 mg by mouth every 8 (eight) hours as needed for nausea or vomiting.   Yes [provider]  testosterone enanthate (DELATESTRYL) 200 MG/ML injection Inject into the muscle every 14 (fourteen) days. For IM use only   Yes [provider]  butorphanol (STADOL) 10 MG/ML nasal spray Place 2 sprays into the nose every 4 (four) hours as needed. For allergies    [provider]  fexofenadine (ALLEGRA) 30 MG tablet Take 30 mg by mouth 2 (two) times daily.    [provider]  predniSONE  (DELTASONE) 20 MG tablet Take 20 mg by mouth daily. Patient started this medication on November 18, 2011 Tapered:  3 a day for 3 days, 2 a day for 3 days, 1 a day for 3 days.    [provider]  rOPINIRole (REQUIP) 0.25 MG tablet Take 6 mg by mouth daily.     [provider]  sodium chloride 0.9 % SOLN with hydrocortisone sodium succinate 100 mg/2 mL SOLR 10 mg/mL Inject into the vein continuous.    [provider]    Family History No family history on file.  Social History Social History  Substance Use Topics  . Smoking status: Never Smoker  . Smokeless tobacco: Never Used  . Alcohol use Yes     Comment: rarely     Allergies   Benadryl [diphenhydramine hcl]; Compazine [prochlorperazine edisylate]; Demerol; Dopamine; Morphine and related; Promethazine hcl; and Vicodin [hydrocodone-acetaminophen]   Review of Systems Review of Systems  Musculoskeletal: Positive for arthralgias and joint swelling.  Skin: Positive for wound.  Neurological: Negative for weakness and numbness.  All other systems reviewed and are negative.    Physical Exam Updated Vital Signs BP (!) 136/94 (BP Location: Right Arm)   Pulse 60   Temp 98.3 F (36.8 C) (Oral)   Resp 14  Ht 6' (1.829 m)   Wt 88.5 kg (195 lb)   SpO2 98%   BMI 26.45 kg/m   Physical Exam  Constitutional: He is oriented to person, place, and time. He appears well-developed and well-nourished.  HENT:  Head: Normocephalic and atraumatic.  Eyes: Pupils are equal, round, and reactive to light.  Neck: Normal range of motion. Neck supple.  Cardiovascular: Normal rate.   Pulmonary/Chest: Effort normal.  Abdominal: Soft.  Musculoskeletal: Normal range of motion. He exhibits edema and tenderness.  Patient with swelling and ecchymosis to the fifth digit of the right foot. Diffuse tender to palpation. Good distal cap refill.  Neurological: He is alert and oriented to person, place, and time.  Sensation to the  distal tip of the fifth toe of the right foot is intact. Patient's abdomen toes but there is some pain with movement.  Skin: Skin is warm and dry. No rash noted. No erythema.  Psychiatric: He has a normal mood and affect. His behavior is normal.  Nursing note and vitals reviewed.    ED Treatments / Results  Labs (all labs ordered are listed, but only abnormal results are displayed) Labs Reviewed - No data to display  EKG  EKG Interpretation None       Radiology Dg Toe 5th Right  Result Date: 10/15/2016 CLINICAL DATA:  Stubbed right fifth toe on 2 x 4. Initial encounter. EXAM: RIGHT FIFTH TOE COMPARISON:  None. FINDINGS: There is no evidence of fracture or dislocation. There is no evidence of arthropathy or other focal bone abnormality. Soft tissues are unremarkable. IMPRESSION: Negative. Electronically Signed   By: Marnee SpringJonathon  Watts M.D.   On: 10/15/2016 16:15    Procedures Procedures (including critical care time)  Medications Ordered in ED Medications - No data to display   Initial Impression / Assessment and Plan / ED Course  I have reviewed the triage vital signs and the nursing notes.  Pertinent labs & imaging results that were available during my care of the patient were reviewed by me and considered in my medical decision making (see chart for details).     No evidence of fracture on x-ray. Recommend rice therapy. Weight-bear as tolerated.  Final Clinical Impressions(s) / ED Diagnoses   Final diagnoses:  Sprain of fifth toe of right foot, initial encounter    New Prescriptions New Prescriptions   No medications on file     Loren RacerYelverton, Calypso Hagarty, MD 10/15/16 [phone]

## 2016-10-15 NOTE — ED Triage Notes (Signed)
Pt reports stubbing his R pinky toe on a board yesterday afternoon; pt able to ambulate with a limp. Denies pain meds PTA.

## 2017-07-07 ENCOUNTER — Emergency Department (HOSPITAL_BASED_OUTPATIENT_CLINIC_OR_DEPARTMENT_OTHER)
Admission: EM | Admit: 2017-07-07 | Discharge: 2017-07-08 | Disposition: A | Discharge status: Home / Self Care | Payer: Medicare Other | Attending: Physician Assistant | Admitting: Physician Assistant

## 2017-07-07 ENCOUNTER — Other Ambulatory Visit: Payer: Self-pay

## 2017-07-07 ENCOUNTER — Encounter (HOSPITAL_BASED_OUTPATIENT_CLINIC_OR_DEPARTMENT_OTHER): Payer: Self-pay

## 2017-07-07 DIAGNOSIS — R110 Nausea: Secondary | ICD-10-CM | POA: Diagnosis not present

## 2017-07-07 DIAGNOSIS — Y939 Activity, unspecified: Secondary | ICD-10-CM | POA: Diagnosis not present

## 2017-07-07 DIAGNOSIS — Y999 Unspecified external cause status: Secondary | ICD-10-CM | POA: Diagnosis not present

## 2017-07-07 DIAGNOSIS — W260XXA Contact with knife, initial encounter: Secondary | ICD-10-CM | POA: Diagnosis not present

## 2017-07-07 DIAGNOSIS — S6982XA Other specified injuries of left wrist, hand and finger(s), initial encounter: Secondary | ICD-10-CM | POA: Diagnosis present

## 2017-07-07 DIAGNOSIS — Z79899 Other long term (current) drug therapy: Secondary | ICD-10-CM | POA: Insufficient documentation

## 2017-07-07 DIAGNOSIS — S61215A Laceration without foreign body of left ring finger without damage to nail, initial encounter: Secondary | ICD-10-CM | POA: Diagnosis not present

## 2017-07-07 DIAGNOSIS — Z87820 Personal history of traumatic brain injury: Secondary | ICD-10-CM | POA: Insufficient documentation

## 2017-07-07 DIAGNOSIS — Y929 Unspecified place or not applicable: Secondary | ICD-10-CM | POA: Insufficient documentation

## 2017-07-07 DIAGNOSIS — R51 Headache: Secondary | ICD-10-CM | POA: Diagnosis not present

## 2017-07-07 MED ORDER — CEPHALEXIN 500 MG PO CAPS: 500.0000 mg | ORAL_CAPSULE | Freq: Four times a day (QID) | ORAL | 0 refills | Status: AC

## 2017-07-07 MED ORDER — LIDOCAINE HCL (PF) 1 % IJ SOLN
10.0000 mL | Freq: Once | INTRAMUSCULAR | Status: AC
  Administered 2017-07-07: 10 mL via INTRADERMAL
  Filled 2017-07-07: qty 10

## 2017-07-07 MED ORDER — BACITRACIN ZINC 500 UNIT/GM EX OINT
TOPICAL_OINTMENT | Freq: Once | CUTANEOUS | Status: AC
  Administered 2017-07-07: 1 via TOPICAL
  Filled 2017-07-07: qty 28.35

## 2017-07-07 MED ORDER — ONDANSETRON 4 MG PO TBDP
4.0000 mg | ORAL_TABLET | Freq: Once | ORAL | Status: AC
  Administered 2017-07-07: 4 mg via ORAL
  Filled 2017-07-07: qty 1

## 2017-07-07 MED ORDER — TETANUS-DIPHTH-ACELL PERTUSSIS 5-2.5-18.5 LF-MCG/0.5 IM SUSP
0.5000 mL | Freq: Once | INTRAMUSCULAR | Status: DC
  Filled 2017-07-07: qty 0.5

## 2017-07-07 NOTE — Discharge Instructions (Signed)
Keep the wound clean and dry for the first 24 hours. After that you may gently clean the wound with soap and water. Make sure to pat dry the wound before covering it with any dressing. You can use topical antibiotic ointment and bandage. Ice and elevate for pain relief.   You can take Tylenol or Ibuprofen as directed for pain. You can alternate Tylenol and Ibuprofen every 4 hours for additional pain relief.   Return to the Emergency Department, your primary care doctor, or the [facility] in 7 days for suture removal.   Monitor closely for any signs of infection. Return to the Emergency Department for any worsening redness/swelling of the area that begins to spread, drainage from the site, worsening pain, fever or any other worsening or concerning symptoms.    

## 2017-07-07 NOTE — ED Notes (Addendum)
Pt states he has migraine and nausea-requested zofran for nausea

## 2017-07-07 NOTE — ED Triage Notes (Addendum)
Pt states he cut left ring finger with kitchen knife approx 5pm-superfical lac noted-no bleeding-gauze/klin applied-NAD-steady gait

## 2017-07-07 NOTE — ED Notes (Signed)
Pt verbalizes understanding of d/c instructions and denies any further needs at this time. 

## 2017-07-07 NOTE — ED Provider Notes (Signed)
[facility] EMERGENCY DEPARTMENT Provider Note   CSN: [id number] Arrival date & time: 07/07/17  1817     History   Chief Complaint Chief Complaint  Patient presents with  . Finger Injury    HPI Seth Arellano is a 47 y.o. male who presents for evaluation of left fourth finger laceration that occurred approximately 5 PM  HPI  Past Medical History:  Diagnosis Date  . Adrenal insufficiency (Addison's disease) (HCC)   . Chemical meningitis   . CSF leak   . H/O brain surgery   . Migraine   . Migraine   . Pituitary abnormality (HCC)   . TBI (traumatic brain injury) (HCC)     There are no active problems to display for this patient.   Past Surgical History:  Procedure Laterality Date  . BACK SURGERY    . BRAIN TUMOR EXCISION     non milignant tumor.  . joint surgeries    . LUMBAR PERITONEAL SHUNT         Home Medications    Prior to Admission medications   Medication Sig Start Date End Date Taking? Authorizing Provider  butorphanol (STADOL) 10 MG/ML nasal spray Place 2 sprays into the nose every 4 (four) hours as needed. For allergies    [provider]  cephALEXin (KEFLEX) 500 MG capsule Take 1 capsule (500 mg total) by mouth 4 (four) times daily for 7 days. 07/07/17 07/14/17  Maxwell Caul, PA-C  DICLOFENAC POTASSIUM PO Take by mouth.    [provider]  fexofenadine (ALLEGRA) 30 MG tablet Take 30 mg by mouth 2 (two) times daily.    [provider]  hydrocortisone (CORTEF) 20 MG tablet Take 30 mg by mouth daily.    [provider]  loratadine (CLARITIN) 10 MG tablet Take 10 mg by mouth daily.    [provider]  METHADONE HCL PO Take by mouth.    [provider]  ondansetron (ZOFRAN) 4 MG tablet Take 4 mg by mouth every 8 (eight) hours as needed for nausea or vomiting.    [provider]  predniSONE (DELTASONE) 20 MG tablet Take 20 mg by mouth daily. Patient started this medication on  November 18, 2011 Tapered:  3 a day for 3 days, 2 a day for 3 days, 1 a day for 3 days.    [provider]  rOPINIRole (REQUIP) 0.25 MG tablet Take 6 mg by mouth daily.     [provider]  sodium chloride 0.9 % SOLN with hydrocortisone sodium succinate 100 mg/2 mL SOLR 10 mg/mL Inject into the vein continuous.    [provider]  testosterone enanthate (DELATESTRYL) 200 MG/ML injection Inject into the muscle every 14 (fourteen) days. For IM use only    [provider]    Family History No family history on file.  Social History Social History   Tobacco Use  . Smoking status: Never Smoker  . Smokeless tobacco: Never Used  Substance Use Topics  . Alcohol use: Yes    Comment: rarely  . Drug use: No     Allergies   Benadryl [diphenhydramine hcl]; Compazine [prochlorperazine edisylate]; Demerol; Dopamine; Morphine and related; Promethazine hcl; and Vicodin [hydrocodone-acetaminophen]   Review of Systems Review of Systems  Skin: Positive for wound.  Neurological: Negative for weakness and numbness.     Physical Exam Updated Vital Signs BP 139/89 (BP Location: Right Arm)   Pulse 61   Resp 16   Ht 5\' 11"  (  1.803 m)   Wt 90.3 kg (199 lb)   SpO2 98%   BMI 27.75 kg/m   Physical Exam  Constitutional: He appears well-developed and well-nourished.  HENT:  Head: Normocephalic and atraumatic.  Eyes: Conjunctivae and EOM are normal. Right eye exhibits no discharge. Left eye exhibits no discharge. No scleral icterus.  Cardiovascular:  Pulses:      Radial pulses are 2+ on the right side, and 2+ on the left side.  Pulmonary/Chest: Effort normal.  Musculoskeletal:  No tenderness palpation to the left wrist.  Full range of motion of left digits without any difficulty.  Good flexion/extension of left fourth digit without any difficulty.  DIP has good flexion/extension when held in isolation.  Neurological: He is alert.  Skin: Skin is warm and dry.  Capillary refill takes less than 2 seconds.  1.5 cm curvilinear laceration noted to the palmar fist of the left fourth digit.  Distal cap refill.  Psychiatric: He has a normal mood and affect. His speech is normal and behavior is normal.  Nursing note and vitals reviewed.    ED Treatments / Results  Labs (all labs ordered are listed, but only abnormal results are displayed) Labs Reviewed - No data to display  EKG  EKG Interpretation None       Radiology No results found.  Procedures .Marland Kitchen.Laceration Repair Date/Time: 07/07/2017 11:32 PM Performed by: Maxwell CaulLayden, Drew Herman A, PA-C Authorized by: Maxwell CaulLayden, Wania Longstreth A, PA-C   Consent:    Consent obtained:  Verbal   Consent given by:  Patient   Risks discussed:  Infection, pain and poor cosmetic result   Alternatives discussed:  No treatment Anesthesia (see MAR for exact dosages):    Anesthesia method:  Nerve block   Block needle gauge:  25 G   Block anesthetic:  Lidocaine 1% w/o epi   Block injection procedure:  Anatomic landmarks identified, incremental injection, negative aspiration for blood and anatomic landmarks palpated   Block outcome:  Anesthesia achieved Laceration details:    Location:  Finger   Finger location:  L ring finger   Length (cm):  1.5 Repair type:    Repair type:  Simple Pre-procedure details:    Preparation:  Patient was prepped and draped in usual sterile fashion Exploration:    Hemostasis achieved with:  Direct pressure   Wound exploration: wound explored through full range of motion     Wound extent: no foreign bodies/material noted and no tendon damage noted   Treatment:    Area cleansed with:  Betadine and saline   Amount of cleaning:  Extensive   Irrigation solution:  Sterile saline   Irrigation method:  Syringe   Visualized foreign bodies/material removed: no   Skin repair:    Repair method:  Sutures   Suture size:  5-0   Suture material:  Nylon   Suture technique:  Simple interrupted    Number of sutures:  8 Approximation:    Approximation:  Close   Vermilion border: well-aligned   Post-procedure details:    Dressing:  Sterile dressing   Patient tolerance of procedure:  Tolerated well, no immediate complications   (including critical care time)  Medications Ordered in ED Medications  bacitracin ointment (not administered)  Tdap (BOOSTRIX) injection 0.5 mL (not administered)  ondansetron (ZOFRAN-ODT) disintegrating tablet 4 mg (4 mg Oral Given 07/07/17 1830)  lidocaine (PF) (XYLOCAINE) 1 % injection 10 mL (10 mLs Intradermal Given 07/07/17 2240)     Initial Impression / Assessment and Plan /  ED Course  I have reviewed the triage vital signs and the nursing notes.  Pertinent labs & imaging results that were available during my care of the patient were reviewed by me and considered in my medical decision making (see chart for details).     47 y.o. M who presents for evaluation of left fourth digit duration that occurred approximate 5 PM this afternoon.  Patient reports that he was cutting it with a knife and the knife slipped and cut his finger.  No numbness/weakness.  He does not know when his last tetanus was. Patient is afebrile, non-toxic appearing, sitting comfortably on examination table. Patient is neurovascularly intact.  On exam, he has a 1.5 cm curvature linear laceration to the palmar aspect of the distal left fourth digit.  Flexion/extension of all joints intact without difficulty.  No evidence of foreign body.  Do not suspect tendon injury.  It does not overlying any joint area.  Plan to update tetanus, provide wound care and will plan to repair in the department.  Laceration repaired as documented above.  Patient tolerated procedure well.  Wound care instructions discussed with patient.  Updated in the department. Plan to start on antibiotic therapy. Patient had ample opportunity for questions and discussion. All patient's questions were answered with full  understanding. Strict return precautions discussed. Patient expresses understanding and agreement to plan.    Final Clinical Impressions(s) / ED Diagnoses   Final diagnoses:  Laceration of left ring finger without damage to nail, foreign body presence unspecified, initial encounter    ED Discharge Orders        Ordered    cephALEXin (KEFLEX) 500 MG capsule  4 times daily     07/07/17 2316       Rosana Hoes 07/07/17 2335    Abelino Derrick, MD 07/12/17 [phone]

## 2017-07-07 NOTE — ED Notes (Signed)
ED Provider at bedside.
